# Patient Record
Sex: Female | Born: 2000 | Race: Black or African American | Hispanic: No | Marital: Single | Smoking: Never smoker
Health system: Southern US, Community
[De-identification: ages and names within clinical notes are randomized; demographics above are authoritative.]

## PROBLEM LIST (undated history)

## (undated) DIAGNOSIS — D649 Anemia, unspecified: Secondary | ICD-10-CM

## (undated) HISTORY — DX: Anemia, unspecified: D64.9

## (undated) HISTORY — PX: NO PAST SURGERIES: SHX2092

---
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
## 2021-10-15 ENCOUNTER — Other Ambulatory Visit: Payer: Self-pay

## 2021-10-15 ENCOUNTER — Encounter: Payer: Self-pay | Admitting: Advanced Practice Midwife

## 2021-10-15 ENCOUNTER — Ambulatory Visit: Payer: Self-pay | Admitting: Advanced Practice Midwife

## 2021-10-15 DIAGNOSIS — Z113 Encounter for screening for infections with a predominantly sexual mode of transmission: Secondary | ICD-10-CM

## 2021-10-15 NOTE — Progress Notes (Signed)
[facility] ? ?STI clinic/screening visit ?[address] ?[place] [address] ?[phone] ? ?Subjective:  ?Joy Johnson is a 21 y.o. SBF G1P0 nonsmoker female being seen today for an STI screening visit. The patient reports they do have symptoms.  Patient reports that they do not desire a pregnancy in the next year.   They reported they are not interested in discussing contraception today.   ? ?Patient's last menstrual period was 08/29/2021 (approximate). ? ? ?Patient has the following medical conditions:  There are no problems to display for this patient. ? ? ?Chief Complaint  ?Patient presents with  ? SEXUALLY TRANSMITTED DISEASE  ? ? ?HPI ? ?Patient reports c/o internal vaginal itching x 2 wks with yeasty odor. LMP 08/29/21. Last sex 2019. EAB 10/31/2017. Denies cigs, vaping, cigars, ETOH.  ? ?Last HIV test per patient/review of record was never ?Patient reports last pap was n/a ? ?Screening for MPX risk: ?Does the patient have an unexplained rash? No ?Is the patient MSM? No ?Does the patient endorse multiple sex partners or anonymous sex partners? No ?Did the patient have close or sexual contact with a person diagnosed with MPX? No ?Has the patient traveled outside the [place] where MPX is endemic? No ?Is there a high clinical suspicion for MPX-- evidenced by one of the following No ? -Unlikely to be chickenpox ? -Lymphadenopathy ? -Rash that present in same phase of evolution on any given body part ?See flowsheet for further details and programmatic requirements.  ? ? ?The following portions of the patient's history were reviewed and updated as appropriate: allergies, current medications, past medical history, past social history, past surgical history and problem list. ? ?Objective:  ?There were no vitals filed for this visit. ? ?Physical Exam ?Vitals and nursing note reviewed.  ?Constitutional:   ?   Appearance: Normal appearance. She is normal weight.  ?HENT:  ?   Head:  Normocephalic and atraumatic.  ?   Mouth/Throat:  ?   Mouth: Mucous membranes are moist.  ?   Pharynx: Oropharynx is clear. No oropharyngeal exudate or posterior oropharyngeal erythema.  ?Eyes:  ?   Conjunctiva/sclera: Conjunctivae normal.  ?Pulmonary:  ?   Effort: Pulmonary effort is normal.  ?Abdominal:  ?   General: Abdomen is flat.  ?   Palpations: Abdomen is soft. There is no mass.  ?   Tenderness: There is no abdominal tenderness. There is no rebound.  ?   Comments: Soft without masses or tenderness, good tone  ?Genitourinary: ?   General: Normal vulva.  ?   Exam position: Lithotomy position.  ?   Pubic Area: No rash or pubic lice.   ?   Labia:     ?   Right: No rash or lesion.     ?   Left: No rash or lesion.   ?   Vagina: Vaginal discharge (white creamy leukorrhea, ph<4.5) present. No erythema, bleeding or lesions.  ?   Cervix: Normal.  ?   Uterus: Normal.   ?   Adnexa: Right adnexa normal and left adnexa normal.  ?   Rectum: Normal.  ?Lymphadenopathy:  ?   Head:  ?   Right side of head: No preauricular or posterior auricular adenopathy.  ?   Left side of head: No preauricular or posterior auricular adenopathy.  ?   Cervical: No cervical adenopathy.  ?   Right cervical: No superficial, deep or posterior cervical adenopathy. ?   Left cervical: No  superficial, deep or posterior cervical adenopathy.  ?   Upper Body:  ?   Right upper body: No supraclavicular or axillary adenopathy.  ?   Left upper body: No supraclavicular or axillary adenopathy.  ?   Lower Body: No right inguinal adenopathy. No left inguinal adenopathy.  ?Skin: ?   General: Skin is warm and dry.  ?   Findings: No rash.  ?Neurological:  ?   Mental Status: She is alert and oriented to person, place, and time.  ? ? ? ?Assessment and Plan:  ?Joy Johnson is a 21 y.o. female presenting to the [facility] Department for STI screening ? ?1. Screening examination for venereal disease ?Treat wet mount per standing  orders ?Immunization nurse consult ?Need to send wet prep to [facility] due to lab staff shortage today ? ?- WET PREP FOR TRICH, YEAST, CLUE ?- Chlamydia/Gonorrhea [facility] ? ? ? ? ?Return if symptoms worsen or fail to improve. ? ?No future appointments. ? ?Alberteen Spindle, CNM ?

## 2021-10-15 NOTE — Progress Notes (Signed)
Patient here for STD testing. Condoms declined. Providers orders completed.  ?

## 2021-10-16 LAB — WET PREP FOR TRICH, YEAST, CLUE
Clue Cell Exam: POSITIVE — AB
Trichomonas Exam: NEGATIVE
Yeast Exam: NEGATIVE

## 2021-11-12 ENCOUNTER — Ambulatory Visit: Payer: Federal, State, Local not specified - PPO

## 2021-11-12 DIAGNOSIS — Z708 Other sex counseling: Secondary | ICD-10-CM

## 2021-11-12 NOTE — Progress Notes (Signed)
In Nurse clinic requesting results from 10/15/2021. Gonorrhea and chlamydia test results negative and explained to pt. Declines paper copy of results. Questions answered and reports understanding. Jerel Shepherd, RN ? ?

## 2021-12-04 ENCOUNTER — Encounter: Payer: Self-pay | Admitting: Family Medicine

## 2021-12-04 ENCOUNTER — Ambulatory Visit (LOCAL_COMMUNITY_HEALTH_CENTER): Payer: Federal, State, Local not specified - PPO | Admitting: Family Medicine

## 2021-12-04 VITALS — BP 108/64 | Ht 66.0 in | Wt 128.8 lb

## 2021-12-04 DIAGNOSIS — Z30012 Encounter for prescription of emergency contraception: Secondary | ICD-10-CM

## 2021-12-04 DIAGNOSIS — Z30011 Encounter for initial prescription of contraceptive pills: Secondary | ICD-10-CM

## 2021-12-04 DIAGNOSIS — Z3009 Encounter for other general counseling and advice on contraception: Secondary | ICD-10-CM

## 2021-12-04 LAB — HM HIV SCREENING LAB: HM HIV Screening: NEGATIVE

## 2021-12-04 LAB — WET PREP FOR TRICH, YEAST, CLUE
Trichomonas Exam: NEGATIVE
Yeast Exam: NEGATIVE

## 2021-12-04 MED ORDER — LEVONORGESTREL 1.5 MG PO TABS: 1.5000 mg | ORAL_TABLET | Freq: Once | ORAL | 0 refills | Status: AC

## 2021-12-04 MED ORDER — NORGESTIM-ETH ESTRAD TRIPHASIC 0.18/0.215/0.25 MG-35 MCG PO TABS: 1.0000 | ORAL_TABLET | Freq: Every day | ORAL | 12 refills | Status: AC

## 2021-12-04 NOTE — Progress Notes (Signed)
OC and ECP consents signed and patient counseled about how to take both. Patient states understanding.Burt Knack, RN  ?

## 2021-12-04 NOTE — Progress Notes (Signed)
[facility] DEPARTMENT ?Family Planning Clinic ?[address] [employer] ?Main Number: [phone] ? ? ? ?Family Planning Visit- Initial Visit ? ?Subjective:  ?Joy Johnson is a 21 y.o.  G1P0010   being seen today for an initial annual visit and to discuss reproductive life planning.  The patient is currently using No Method - No Contraceptive Precautions for pregnancy prevention. Patient reports   does not want a pregnancy in the next year.   ? ? report they are looking for a method that provides Discrete method, Ready when they are , Long term method, and Methods that does not involve too much memory ? ?Patient has the following medical conditions does not have a problem list on file. ? ?Chief Complaint  ?Patient presents with  ? Gynecologic Exam  ? ? ?Patient reports here for screening  ? ?Patient denies any concerns   ? ?Body mass index is 20.79 kg/m?. - Patient is eligible for diabetes screening based on BMI and age >72?  not applicable ?HA1C ordered? not applicable ? ?Patient reports 1  partner/s in last year. Desires STI screening?  Yes ? ?Has patient been screened once for HCV in the past?  No ? No results found for: HCVAB ? ?Does the patient have current drug use (including MJ), have a partner with drug use, and/or has been incarcerated since last result? No  ?If yes-- Screen for HCV through [facility] ?  ?Does the patient meet criteria for HBV testing? No ? ?Criteria:  ?-Household, sexual or needle sharing contact with HBV ?-History of drug use ?-HIV positive ?-Those with known Hep C ? ? ?Health Maintenance Due  ?Topic Date Due  ? CHLAMYDIA SCREENING  Never done  ? HIV Screening  Never done  ? Hepatitis C Screening  Never done  ? ? ?Review of Systems  ?Constitutional:  Negative for chills, fever, malaise/fatigue and weight loss.  ?HENT:  Negative for congestion, hearing loss and sore throat.   ?Eyes:  Negative for blurred vision, double vision and photophobia.  ?Respiratory:   Negative for shortness of breath.   ?Cardiovascular:  Negative for chest pain.  ?Gastrointestinal:  Positive for nausea. Negative for abdominal pain, blood in stool, constipation, diarrhea, heartburn and vomiting.  ?Genitourinary:  Negative for dysuria and frequency.  ?Musculoskeletal:  Negative for back pain, joint pain and neck pain.  ?Skin:  Negative for itching and rash.  ?Neurological:  Positive for headaches. Negative for dizziness and weakness.  ?Endo/Heme/Allergies:  Does not bruise/bleed easily.  ?Psychiatric/Behavioral:  Negative for depression, substance abuse and suicidal ideas.   ? ?The following portions of the patient's history were reviewed and updated as appropriate: allergies, current medications, past family history, past medical history, past social history, past surgical history and problem list. Problem list updated. ? ? ?See flowsheet for other program required questions. ? ?Objective:  ? ?Vitals:  ? 12/04/21 1510  ?BP: 108/64  ?Weight: 128 lb 12.8 oz (58.4 kg)  ?Height: 5\' 6"  (1.676 m)  ? ? ?Physical Exam ?Vitals and nursing note reviewed.  ?Constitutional:   ?   Appearance: Normal appearance.  ?HENT:  ?   Head: Normocephalic and atraumatic.  ?   Mouth/Throat:  ?   Mouth: Mucous membranes are moist.  ?   Dentition: Normal dentition. No dental caries.  ?   Pharynx: No oropharyngeal exudate or posterior oropharyngeal erythema.  ?Eyes:  ?   General: No scleral icterus. ?Neck:  ?   Thyroid: No thyroid mass,  thyromegaly or thyroid tenderness.  ?Cardiovascular:  ?   Rate and Rhythm: Normal rate and regular rhythm.  ?   Pulses: Normal pulses.  ?   Heart sounds: Normal heart sounds.  ?Pulmonary:  ?   Effort: Pulmonary effort is normal.  ?   Breath sounds: Normal breath sounds.  ?Abdominal:  ?   General: Abdomen is flat. Bowel sounds are normal.  ?   Palpations: Abdomen is soft.  ?Genitourinary: ?   Comments: Deferred, pt self collected  ?Musculoskeletal:     ?   General: Normal range of motion.  ?    Cervical back: Normal range of motion and neck supple.  ?Skin: ?   General: Skin is warm and dry.  ?Neurological:  ?   General: No focal deficit present.  ?   Mental Status: She is alert and oriented to person, place, and time.  ?Psychiatric:     ?   Mood and Affect: Mood normal.     ?   Behavior: Behavior normal.  ? ? ? ? ?Assessment and Plan:  ?Joy Johnson is a 21 y.o. female presenting to the [facility] Department for an initial annual wellness/contraceptive visit ? ?Contraception counseling: Reviewed options based on patient desire and reproductive life plan. Patient is interested in Oral Contraceptive. This was provided to the patient today.  ? ?Risks, benefits, and typical effectiveness rates were reviewed.  Questions were answered.  Written information was also given to the patient to review.   ? ?The patient will follow up in  6 months for surveillance.  The patient was told to call with any further questions, or with any concerns about this method of contraception.  Emphasized use of condoms 100% of the time for STI prevention. ? ?Need for ECP was assessed. Patient reported Unprotected sex within past 120 hours.  Reviewed options and patient desired Plan B (levonorgestrel)  ? ?1. Family planning services ?Well woman exam today  ?Discussed need for CBE at 21 yrs old and pap at 22 yrs . Old.  ? ?- HIV [place] [facility] ?- Syphilis Serology, [facility] ?- Chlamydia/Gonorrhea [place] [facility] ?- WET PREP FOR TRICH, YEAST, CLUE ? ?2. Emergency contraceptive counseling and prescription ?Discussed SE of plan B  ? ?- levonorgestrel (PLAN B ONE-STEP) 1.5 MG tablet; Take 1 tablet (1.5 mg total) by mouth once for 1 dose.  Dispense: 1 tablet; Refill: 0 ? ?3. Encounter for initial prescription of contraceptive pills ?Start OCP 1 day after taking plan B.  ?- Norgestimate-Ethinyl Estradiol Triphasic (TRI-SPRINTEC) 0.18/0.215/0.25 MG-35 MCG tablet; Take 1 tablet by mouth daily.  Dispense: 28 tablet; Refill:  12 ? ? ? ? ?Return in about 1 year (around 12/05/2022) for annual well woman exam. ? ?No future appointments. ? ?Wendi Snipes, FNP ? ?

## 2021-12-04 NOTE — Progress Notes (Signed)
Here today for a PE and to start birth control. Last PE here was 11/12/2017. Is interested in OCP's. Wants bloodwork for HIV/Syphilis today. Tawny Hopping, RN  ?

## 2021-12-04 NOTE — Progress Notes (Signed)
Patient given 6 packs of 12 packs prescribed per provider verbal orders. Instructed patient to call for an appt to come in to pick up remaining refills when she starts on last pack of pills. Tawny Hopping, RN ? ?

## 2022-07-18 ENCOUNTER — Ambulatory Visit
Admission: RE | Admit: 2022-07-18 | Discharge: 2022-07-18 | Disposition: A | Discharge status: Home / Self Care | Payer: Federal, State, Local not specified - PPO | Source: Ambulatory Visit

## 2022-07-18 NOTE — ED Provider Notes (Signed)
Patient has a history of iron deficiency anemia, last protein level was 9 in 2022, patient states no other taking iron supplement.  Patient has normal vital signs on arrival today.  Patient advised to follow-up with her primary care provider regarding iron deficiency anemia which is most likely cause of her headaches and syncopal episodes at this time.  Patient advised to the emergency room if she has another episode of syncope for further evaluation.  Patient is with her boyfriend today who agrees to take her she faints again.   Theadora Rama Scales, PA-C 07/18/22 1714

## 2022-07-18 NOTE — ED Triage Notes (Signed)
Pt c/o headache, and chills. She states she has been having syncopal episodes (1-2 minutes) for about 1.5 weeks.

## 2022-07-22 ENCOUNTER — Ambulatory Visit (LOCAL_COMMUNITY_HEALTH_CENTER): Payer: Federal, State, Local not specified - PPO

## 2022-07-22 DIAGNOSIS — Z23 Encounter for immunization: Secondary | ICD-10-CM

## 2022-07-22 DIAGNOSIS — Z719 Counseling, unspecified: Secondary | ICD-10-CM

## 2022-07-22 NOTE — Progress Notes (Signed)
Client seen for vaccines today. Flu and Covid-19 offered.  Flu accepted covid-19 declined.  Copy of VIS provided.  Vaccine administered and tolerated well, after vaccine care reviewed.  Copy of NCIR provided.  Ruffin Lada Shelda Pal, RN

## 2022-08-05 ENCOUNTER — Other Ambulatory Visit: Payer: Federal, State, Local not specified - PPO

## 2023-04-13 ENCOUNTER — Ambulatory Visit (LOCAL_COMMUNITY_HEALTH_CENTER): Payer: Federal, State, Local not specified - PPO

## 2023-04-13 ENCOUNTER — Other Ambulatory Visit: Payer: Federal, State, Local not specified - PPO

## 2023-04-13 DIAGNOSIS — Z111 Encounter for screening for respiratory tuberculosis: Secondary | ICD-10-CM

## 2023-04-16 ENCOUNTER — Ambulatory Visit: Payer: Federal, State, Local not specified - PPO

## 2023-04-16 DIAGNOSIS — Z111 Encounter for screening for respiratory tuberculosis: Secondary | ICD-10-CM

## 2023-04-16 LAB — TB SKIN TEST
Induration: 0 mm
TB Skin Test: NEGATIVE

## 2024-03-25 ENCOUNTER — Ambulatory Visit: Payer: Self-pay

## 2024-05-11 ENCOUNTER — Encounter: Admitting: Obstetrics and Gynecology

## 2024-08-01 ENCOUNTER — Ambulatory Visit

## 2024-08-05 ENCOUNTER — Ambulatory Visit

## 2024-08-11 ENCOUNTER — Ambulatory Visit
